# Patient Record
Sex: Male | Born: 1975 | Race: White | Hispanic: No | Marital: Single | Smoking: Current every day smoker
Health system: Southern US, Community
[De-identification: ages and names within clinical notes are randomized; demographics above are authoritative.]

---

## 2005-07-10 ENCOUNTER — Emergency Department (HOSPITAL_COMMUNITY): Admission: EM | Admit: 2005-07-10 | Discharge: 2005-07-10 | Payer: Self-pay | Admitting: Emergency Medicine

## 2007-02-06 ENCOUNTER — Emergency Department (HOSPITAL_COMMUNITY): Admission: EM | Admit: 2007-02-06 | Discharge: 2007-02-06 | Payer: Self-pay | Admitting: Family Medicine

## 2008-03-27 ENCOUNTER — Emergency Department (HOSPITAL_COMMUNITY): Admission: EM | Admit: 2008-03-27 | Discharge: 2008-03-27 | Payer: Self-pay | Admitting: Emergency Medicine

## 2011-01-31 ENCOUNTER — Emergency Department (HOSPITAL_COMMUNITY): Payer: Self-pay

## 2011-01-31 ENCOUNTER — Emergency Department (HOSPITAL_COMMUNITY)
Admission: EM | Admit: 2011-01-31 | Discharge: 2011-01-31 | Disposition: A | Discharge status: Home / Self Care | Payer: Self-pay | Attending: Emergency Medicine | Admitting: Emergency Medicine

## 2011-01-31 DIAGNOSIS — M25519 Pain in unspecified shoulder: Secondary | ICD-10-CM | POA: Insufficient documentation

## 2011-01-31 DIAGNOSIS — Y929 Unspecified place or not applicable: Secondary | ICD-10-CM | POA: Insufficient documentation

## 2011-01-31 DIAGNOSIS — S43109A Unspecified dislocation of unspecified acromioclavicular joint, initial encounter: Secondary | ICD-10-CM | POA: Insufficient documentation

## 2011-07-27 LAB — DIFFERENTIAL
Basophils Absolute: 0
Basophils Relative: 0
Eosinophils Absolute: 0.1
Eosinophils Relative: 1
Lymphocytes Relative: 8 — ABNORMAL LOW
Lymphs Abs: 1.3
Monocytes Absolute: 0.9
Monocytes Relative: 6
Neutro Abs: 13.8 — ABNORMAL HIGH
Neutrophils Relative %: 85 — ABNORMAL HIGH

## 2011-07-27 LAB — CBC
HCT: 49
Hemoglobin: 16.7
MCHC: 34.1
MCV: 90.2
Platelets: 247
RBC: 5.44
RDW: 12.7
WBC: 16.1 — ABNORMAL HIGH

## 2011-07-27 LAB — SAMPLE TO BLOOD BANK

## 2011-07-27 LAB — COMPREHENSIVE METABOLIC PANEL
ALT: 18
Albumin: 3.9
Alkaline Phosphatase: 74
BUN: 15
CO2: 22
Calcium: 9.3
Chloride: 111
GFR calc Af Amer: >60
GFR calc non Af Amer: >60
Potassium: 5.9 — ABNORMAL HIGH
Sodium: 141
Total Bilirubin: 1.8 — ABNORMAL HIGH

## 2011-07-27 LAB — COMPREHENSIVE METABOLIC PANEL WITH GFR
AST: 60 — ABNORMAL HIGH
Creatinine, Ser: 1.12
Glucose, Bld: 104 — ABNORMAL HIGH
Total Protein: 6

## 2011-07-27 LAB — LACTIC ACID, PLASMA: Lactic Acid, Venous: 2.6 — ABNORMAL HIGH

## 2011-07-27 LAB — POTASSIUM: Potassium: 4.1

## 2012-02-12 ENCOUNTER — Emergency Department (HOSPITAL_COMMUNITY): Payer: No Typology Code available for payment source

## 2012-02-12 ENCOUNTER — Emergency Department (HOSPITAL_COMMUNITY)
Admission: EM | Admit: 2012-02-12 | Discharge: 2012-02-13 | Disposition: A | Discharge status: Home / Self Care | Payer: No Typology Code available for payment source | Attending: Emergency Medicine | Admitting: Emergency Medicine

## 2012-02-12 DIAGNOSIS — S0510XA Contusion of eyeball and orbital tissues, unspecified eye, initial encounter: Secondary | ICD-10-CM | POA: Insufficient documentation

## 2012-02-12 DIAGNOSIS — S20219A Contusion of unspecified front wall of thorax, initial encounter: Secondary | ICD-10-CM | POA: Insufficient documentation

## 2012-02-12 DIAGNOSIS — S0003XA Contusion of scalp, initial encounter: Secondary | ICD-10-CM | POA: Insufficient documentation

## 2012-02-12 DIAGNOSIS — S40029A Contusion of unspecified upper arm, initial encounter: Secondary | ICD-10-CM | POA: Insufficient documentation

## 2012-02-12 DIAGNOSIS — IMO0002 Reserved for concepts with insufficient information to code with codable children: Secondary | ICD-10-CM | POA: Insufficient documentation

## 2012-02-12 DIAGNOSIS — S0081XA Abrasion of other part of head, initial encounter: Secondary | ICD-10-CM

## 2012-02-12 DIAGNOSIS — R51 Headache: Secondary | ICD-10-CM | POA: Insufficient documentation

## 2012-02-12 DIAGNOSIS — R404 Transient alteration of awareness: Secondary | ICD-10-CM | POA: Insufficient documentation

## 2012-02-12 DIAGNOSIS — H05239 Hemorrhage of unspecified orbit: Secondary | ICD-10-CM

## 2012-02-12 LAB — POCT I-STAT, CHEM 8
BUN: 22 mg/dL (ref 6–23)
Sodium: 141 mEq/L (ref 135–145)

## 2012-02-12 MED ORDER — IOHEXOL 300 MG/ML  SOLN
100.0000 mL | Freq: Once | INTRAMUSCULAR | Status: AC | PRN
  Administered 2012-02-12: 100 mL via INTRAVENOUS
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
## 2012-02-12 NOTE — ED Notes (Signed)
Using urinal, BP improved, states, "don't really need anything for pain", no changes, VSS.
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
## 2012-02-12 NOTE — Progress Notes (Signed)
Chaplain responded to trauma page. Chaplain notified patient's girlfriend, Sharion Settler, at [phone]. Chaplain will continue to follow up.

## 2012-02-12 NOTE — ED Notes (Signed)
ACT team at bedside.  

## 2012-02-12 NOTE — ED Notes (Addendum)
Pt reports to Avril RT while in CT that he feels hopeless, has a no family support, friends, and lack of financial support. Pt's girlfriend was called and could not come to ED d/t no transportation, pt's parents passed away years ago, the couple that "raised" pt after parents death refused to come to hospital and pt requested staff not to contact them again.

## 2012-02-12 NOTE — ED Notes (Signed)
Pt not in room, remains in radiology.  
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
## 2012-02-12 NOTE — ED Notes (Signed)
Pt back from radiology, alert, NAD, calm, interactive, skin W&D, resps e/u, speaking in clear complete sentences, MAEx4, PERRL 6mm brisk, c-collar remains in place, IV sites flushed, reports mild back discomfort from lying supine, no real generalized pain, rates neck pain 3/10, denies sob nausea or other sx. Talking on phone.
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
## 2012-02-12 NOTE — ED Notes (Signed)
Pt talking on phone, EDP into room to re-assess pt, c-collar removed, HOB elevated.

## 2012-02-12 NOTE — ED Notes (Signed)
BP low upon sitting up, new liter hung to R FA, BP rechecked and improved, NAD, calm, alert, interactive, still using phone.

## 2012-02-12 NOTE — ED Notes (Signed)
RN found ACT team member to come see pt. She acknowledged she will be in to see him but did not get the phone call.
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
## 2012-02-13 NOTE — Discharge Instructions (Signed)
RESOURCE GUIDE    No Primary Care Doctor Call [facility]  [phone] Other agencies that provide inexpensive medical care    [facility]  [phone]    [facility]  [phone]    [facility]  [phone]    [facility]  [phone]    [facility]  [phone]    [facility]  [phone]  Psychological Services [facility]  [phone] [facility]  [phone] [facility]   [phone] (emergency services [phone])  Substance Abuse Resources [facility]  [phone] [facility] [phone] [address] [phone] Floydene Flock [phone] Residential & Outpatient Substance Abuse Program  [phone]      [facility] Resources  [facility]     [employer]                          [facility] Dept. [address] [place]                       [address]      [address]  Blondell Reveal Phone:  [phone]                                   Phone:  [phone]                 Phone:  [phone]  [facility] Phone:  [phone]  [facility] Child Abuse Hotline [phone] (After Hours)
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
## 2012-02-16 NOTE — ED Provider Notes (Signed)
I saw and evaluated the patient, reviewed the resident's note and I agree with the findings and plan.   .Face to face Exam:  General:  Awake HEENT:  HEMATOMA Resp:  Normal effort Abd:  Nondistended Neuro:No focal weakness Lymph: No adenopathy   Nelia Shi, MD 02/16/12 [phone]
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
## 2018-09-13 ENCOUNTER — Other Ambulatory Visit: Payer: Self-pay

## 2018-09-13 ENCOUNTER — Ambulatory Visit (HOSPITAL_COMMUNITY)
Admission: EM | Admit: 2018-09-13 | Discharge: 2018-09-13 | Disposition: A | Discharge status: Home / Self Care | Payer: Self-pay | Attending: Family Medicine | Admitting: Family Medicine

## 2018-09-13 ENCOUNTER — Encounter (HOSPITAL_COMMUNITY): Payer: Self-pay | Admitting: Emergency Medicine

## 2018-09-13 DIAGNOSIS — K047 Periapical abscess without sinus: Secondary | ICD-10-CM

## 2018-09-13 MED ORDER — CEFTRIAXONE SODIUM 1 G IJ SOLR
INTRAMUSCULAR | Status: AC
  Filled 2018-09-13: qty 10

## 2018-09-13 MED ORDER — HYDROCODONE-ACETAMINOPHEN 5-325 MG PO TABS: 1.0000 | ORAL_TABLET | Freq: Four times a day (QID) | ORAL | 0 refills | Status: AC | PRN

## 2018-09-13 MED ORDER — LIDOCAINE HCL (PF) 1 % IJ SOLN
INTRAMUSCULAR | Status: AC
  Filled 2018-09-13: qty 2

## 2018-09-13 MED ORDER — AMOXICILLIN-POT CLAVULANATE 875-125 MG PO TABS: 1.0000 | ORAL_TABLET | Freq: Two times a day (BID) | ORAL | 0 refills | Status: AC

## 2018-09-13 MED ORDER — CEFTRIAXONE SODIUM 1 G IJ SOLR
1.0000 g | Freq: Once | INTRAMUSCULAR | Status: AC
  Administered 2018-09-13: 1 g via INTRAMUSCULAR

## 2018-09-13 NOTE — ED Provider Notes (Signed)
[facility]    CSN: [id number] Arrival date & time: 09/13/18  0801     History   Chief Complaint Chief Complaint  Patient presents with  . Dental Pain    HPI Kerry Wagner is a 42 y.o. male.   Pt is a 42 year old male who presents with 3 days of left lower dental pain.  This is been constant and worsening.  He has had some left lower jaw swelling and gum swelling  associated with it.  He has been using salt water gargles, Aleve and ibuprofen with minimal relief of pain.  Reports that the more severe pain and swelling started yesterday.  He denies any trouble with secretions or trismus.  He denies any associated fever, chills, body aches. He does not have a regular dentist.   ROS per HPI    Dental Pain    History reviewed. No pertinent past medical history.  There are no active problems to display for this patient.   History reviewed. No pertinent surgical history.     Home Medications    Prior to Admission medications   Medication Sig Start Date End Date Taking? Authorizing Provider  Multiple Vitamin (MULTIVITAMIN) tablet Take 1 tablet by mouth daily.   Yes [provider]  amoxicillin-clavulanate (AUGMENTIN) 875-125 MG tablet Take 1 tablet by mouth every 12 (twelve) hours. 09/13/18   Dahlia ByesBast, Damaya Channing A, NP  HYDROcodone-acetaminophen (NORCO/VICODIN) 5-325 MG tablet Take 1-2 tablets by mouth every 6 (six) hours as needed. 09/13/18   Janace ArisBast, Dacian Orrico A, NP    Family History History reviewed. No pertinent family history.  Social History Social History   Tobacco Use  . Smoking status: Current Every Day Smoker  Substance Use Topics  . Alcohol use: Yes  . Drug use: Never     Allergies   Penicillins   Review of Systems Review of Systems   Physical Exam Triage Vital Signs ED Triage Vitals  Enc Vitals Group     BP 09/13/18 0837 (!) 159/93     Pulse Rate 09/13/18 0837 69     Resp 09/13/18 0837 18     Temp 09/13/18 0837 98.9 F (37.2  C)     Temp Source 09/13/18 0837 Oral     SpO2 09/13/18 0837 97 %     Weight --      Height --      Head Circumference --      Peak Flow --      Pain Score 09/13/18 0842 6     Pain Loc --      Pain Edu? --      Excl. in GC? --    No data found.  Updated Vital Signs BP (!) 159/93 (BP Location: Right Arm) Comment: Reported BP to NP Kemper Hochman  Pulse 69   Temp 98.9 F (37.2 C) (Oral)   Resp 18   SpO2 97%   Visual Acuity Right Eye Distance:   Left Eye Distance:   Bilateral Distance:    Right Eye Near:   Left Eye Near:    Bilateral Near:     Physical Exam  Constitutional: He appears well-developed and well-nourished.  HENT:  Head: Normocephalic and atraumatic.  Mouth/Throat: Oropharynx is clear and moist.    Swelling and erythema to the left lower gum area. Left buccal mucosa swelling, and mild swelling to the oral floor. No trismus. No swelling under the chin. Tender to touch. No obvious abscess.   Eyes: Conjunctivae are normal.  Neck: Normal range of motion. Neck supple.  Pulmonary/Chest: Effort normal.  Musculoskeletal: Normal range of motion.  Lymphadenopathy:    He has no cervical adenopathy.  Neurological: He is alert.  Skin: Skin is warm and dry. No rash noted. No erythema. No pallor.  Psychiatric: He has a normal mood and affect.  Nursing note and vitals reviewed.    UC Treatments / Results  Labs (all labs ordered are listed, but only abnormal results are displayed) Labs Reviewed - No data to display  EKG None  Radiology No results found.  Procedures Procedures (including critical care time)  Medications Ordered in UC Medications  cefTRIAXone (ROCEPHIN) injection 1 g (1 g Intramuscular Given 09/13/18 0918)    Initial Impression / Assessment and Plan / UC Course  I have reviewed the triage vital signs and the nursing notes.  Pertinent labs & imaging results that were available during my care of the patient were reviewed by me and considered  in my medical decision making (see chart for details).    Dental infection Based on the amount of swelling we will do an antibiotic injection of rocephin here in the clinic and send home with Augmentin and hydrocodone. No concern for ludwig's at this time. Pt needs dental follow up. Resources given.  Final Clinical Impressions(s) / UC Diagnoses   Final diagnoses:  Dental abscess     Discharge Instructions     We are giving you an injection here of antibiotics I am sending a prescription of more antibiotics to the pharmacy Follow up with dentist for further management.  If you develop more severe swelling, unable to  open the mouth and trouble swallowing you need to go to the hospital.      ED Prescriptions    Medication Sig Dispense Auth. Provider   amoxicillin-clavulanate (AUGMENTIN) 875-125 MG tablet Take 1 tablet by mouth every 12 (twelve) hours. 14 tablet Tarini Carrier A, NP   HYDROcodone-acetaminophen (NORCO/VICODIN) 5-325 MG tablet Take 1-2 tablets by mouth every 6 (six) hours as needed. 10 tablet Dahlia Byes A, NP     Controlled Substance Prescriptions [place] Controlled Substance Registry consulted? Not Applicable   Janace Aris, NP 09/13/18 [phone]

## 2018-09-13 NOTE — Discharge Instructions (Addendum)
We are giving you an injection here of antibiotics I am sending a prescription of more antibiotics to the pharmacy Follow up with dentist for further management.  If you develop more severe swelling, unable to  open the mouth and trouble swallowing you need to go to the hospital.

## 2018-09-13 NOTE — ED Triage Notes (Signed)
Bottom left dental pain, toothache started on Friday.  Pain and swelling has worsened over the past 2 days

## 2018-09-14 ENCOUNTER — Other Ambulatory Visit: Payer: Self-pay

## 2018-09-14 ENCOUNTER — Emergency Department (HOSPITAL_COMMUNITY)
Admission: EM | Admit: 2018-09-14 | Discharge: 2018-09-14 | Disposition: A | Discharge status: Home / Self Care | Payer: Self-pay | Attending: Emergency Medicine | Admitting: Emergency Medicine

## 2018-09-14 ENCOUNTER — Encounter (HOSPITAL_COMMUNITY): Payer: Self-pay | Admitting: Emergency Medicine

## 2018-09-14 DIAGNOSIS — K0889 Other specified disorders of teeth and supporting structures: Secondary | ICD-10-CM | POA: Insufficient documentation

## 2018-09-14 DIAGNOSIS — F17200 Nicotine dependence, unspecified, uncomplicated: Secondary | ICD-10-CM | POA: Insufficient documentation

## 2018-09-14 DIAGNOSIS — R220 Localized swelling, mass and lump, head: Secondary | ICD-10-CM | POA: Insufficient documentation

## 2018-09-14 MED ORDER — CHLORHEXIDINE GLUCONATE 0.12 % MT SOLN: 15.0000 mL | Freq: Two times a day (BID) | OROMUCOSAL | 0 refills | Status: AC

## 2018-09-14 NOTE — ED Triage Notes (Signed)
Pt arrives to ED from home with complaints of bottom left dental pain starting on friday. Pt reports he was at urgent care center yesterday where they started him on antibiotis. Pt is here today because he said the swelling has comtinued. Pt placed in position of comfort with bed locked and lowered, call bell in reach.

## 2018-09-14 NOTE — ED Provider Notes (Signed)
[facility] EMERGENCY DEPARTMENT Provider Note   CSN: [id number] Arrival date & time: 09/14/18  1114     History   Chief Complaint Chief Complaint  Patient presents with  . Dental Pain    HPI Kerry Wagner is a 42 y.o. male presents for evaluation of acute onset, progressively worsening left-sided facial swelling for 3 days.  He states that he has had left mandibular dental pain for approximately 1 week with swelling that began 3 days ago.  He went to urgent care yesterday for evaluation and was discharged with Augmentin and hydrocodone.  He states that he has had 3 doses of Augmentin but presented to the ED due to worsening facial swelling.  He denies any difficulty breathing or swallowing but states he fears it "may get to that point ".  He denies any significant dental pain at this time but instead notes pressure to the area of swelling.  Denies of fevers, nausea, vomiting, diaphoresis.  No trismus or drooling.  States that he has had a long-standing history of dental but "it has never gotten to this point ".  The history is provided by the patient.    History reviewed. No pertinent past medical history.  There are no active problems to display for this patient.   History reviewed. No pertinent surgical history.      Home Medications    Prior to Admission medications   Medication Sig Start Date End Date Taking? Authorizing Provider  amoxicillin-clavulanate (AUGMENTIN) 875-125 MG tablet Take 1 tablet by mouth every 12 (twelve) hours. 09/13/18   Dahlia Byes A, NP  chlorhexidine (PERIDEX) 0.12 % solution Use as directed 15 mLs in the mouth or throat 2 (two) times daily. 09/14/18   Michela Pitcher A, PA-C  HYDROcodone-acetaminophen (NORCO/VICODIN) 5-325 MG tablet Take 1-2 tablets by mouth every 6 (six) hours as needed. 09/13/18   Dahlia Byes A, NP  Multiple Vitamin (MULTIVITAMIN) tablet Take 1 tablet by mouth daily.    [provider]    Family  History History reviewed. No pertinent family history.  Social History Social History   Tobacco Use  . Smoking status: Current Every Day Smoker  Substance Use Topics  . Alcohol use: Yes  . Drug use: Never     Allergies   Penicillins   Review of Systems Review of Systems  Constitutional: Negative for chills and fever.  HENT: Positive for dental problem and facial swelling. Negative for trouble swallowing and voice change.   Respiratory: Negative for shortness of breath.   Gastrointestinal: Negative for nausea and vomiting.     Physical Exam Updated Vital Signs BP 131/86 (BP Location: Right Arm)   Pulse 100   Temp 99 F (37.2 C)   Ht 5\' 10"  (1.778 m)   Wt (!) 138.3 kg   SpO2 98%   BMI 43.76 kg/m   Physical Exam  Constitutional: He appears well-developed and well-nourished. No distress.  HENT:  Head: Normocephalic and atraumatic.  Left-sided facial swelling along the angle of the ramus.  diffusely decaying dentition with cracked and missing teeth.  Left second molar with significant decay and some tenderness to percussion of the mandible overlying this area.  The gingiva appear inflamed in this area with some swelling and fluctuance.  Dentition appears to be stable. No trismus. Mouth opening to at least 3 finger widths. Handles oral secretions without difficulty. No swelling or tenderness to the submental or submandibular regions. No swelling or tenderness into the soft tissues  of the neck.    Eyes: Conjunctivae are normal. Right eye exhibits no discharge. Left eye exhibits no discharge.  Neck: No JVD present. No tracheal deviation present.  Cardiovascular: Normal rate.  Pulmonary/Chest: Effort normal.  Abdominal: He exhibits no distension.  Musculoskeletal: He exhibits no edema.  Neurological: He is alert.  Skin: Skin is warm and dry. No erythema.  Psychiatric: He has a normal mood and affect. His behavior is normal.  Nursing note and vitals  reviewed.    ED Treatments / Results  Labs (all labs ordered are listed, but only abnormal results are displayed) Labs Reviewed - No data to display  EKG None  Radiology No results found.  Procedures .Marland Kitchen.Incision and Drainage Date/Time: 09/14/2018 12:04 PM Performed by: Jeanie SewerFawze, Nevah Dalal A, PA-C Authorized by: Jeanie SewerFawze, Maranda Marte A, PA-C   Consent:    Consent obtained:  Verbal   Consent given by:  Patient   Risks discussed:  Bleeding, incomplete drainage, infection and pain   Alternatives discussed:  No treatment and delayed treatment Location:    Type:  Abscess   Location:  Head Anesthesia (see MAR for exact dosages):    Anesthesia method:  Topical application   Topical anesthetic:  Benzocaine gel Procedure type:    Complexity:  Simple Procedure details:    Needle aspiration: yes     Needle size:  18 G   Drainage:  Bloody   Drainage amount:  Scant   Wound treatment:  Wound left open Post-procedure details:    Patient tolerance of procedure:  Tolerated well, no immediate complications   (including critical care time)  Medications Ordered in ED Medications - No data to display   Initial Impression / Assessment and Plan / ED Course  I have reviewed the triage vital signs and the nursing notes.  Pertinent labs & imaging results that were available during my care of the patient were reviewed by me and considered in my medical decision making (see chart for details).     Patient with toothache.  Patient is afebrile, vital signs are stable.  He is nontoxic in appearance.  There is some associated facial swelling and fluctuance of the gingiva.  Attempted needle aspiration without any purulent drainage. Tolerating secretions without difficulty. Exam unconcerning for Ludwig's angina or spread of infection.  He has had 3 doses of Augmentin thus far and is tolerating it without difficulty.  Encourage patient to continue antibiotic course.  Recommend follow-up with dentist for  reevaluation.  Discussed strict ED return precautions. Pt verbalized understanding of and agreement with plan and is safe for discharge home at this time.   Final Clinical Impressions(s) / ED Diagnoses   Final diagnoses:  Dentalgia    ED Discharge Orders         Ordered    chlorhexidine (PERIDEX) 0.12 % solution  2 times daily     09/14/18 1203           Jeanie SewerFawze, Kyna Blahnik A, PA-C 09/14/18 1209    Pricilla LovelessGoldston, Scott, MD 09/14/18 [phone]

## 2018-09-14 NOTE — Discharge Instructions (Addendum)
Please take all of your antibiotics until finished!   You may develop abdominal discomfort or diarrhea from the antibiotic.  You may help offset this with probiotics which you can buy or get in yogurt. Do not eat  or take the probiotics until 2 hours after your antibiotic.   Apply cool compresses to jaw throughout the day. Alternate 600 mg of ibuprofen (or two aleve twice daily) and [id number] mg of Tylenol every 3 hours as needed for pain. Do not exceed 4000 mg of Tylenol daily.  You may also use warm water salt gargles, Orajel, or other over-the-counter dental pain remedies. Use Peridex mouthwash twice daily. Followup with a dentist is very important for ongoing evaluation and management of recurrent dental pain.  I have attached resources for follow-up with a dentist on an outpatient basis.  Return to emergency department for emergent changing or worsening symptoms such as fever, worsening facial swelling, difficulty breathing or swallowing, throat tightness, or vision changes.
